# Patient Record
Sex: Male | Born: 1990 | Race: Black or African American | Hispanic: No | Marital: Single | Smoking: Current every day smoker
Health system: Southern US, Community
[De-identification: ages and names within clinical notes are randomized; demographics above are authoritative.]

## PROBLEM LIST (undated history)

## (undated) DIAGNOSIS — J45909 Unspecified asthma, uncomplicated: Secondary | ICD-10-CM

---

## 2016-04-30 ENCOUNTER — Encounter: Payer: Self-pay | Admitting: Emergency Medicine

## 2016-04-30 ENCOUNTER — Emergency Department
Admission: EM | Admit: 2016-04-30 | Discharge: 2016-04-30 | Disposition: A | Discharge status: Home / Self Care | Payer: Medicaid Other | Attending: Emergency Medicine | Admitting: Emergency Medicine

## 2016-04-30 DIAGNOSIS — Z760 Encounter for issue of repeat prescription: Secondary | ICD-10-CM | POA: Diagnosis present

## 2016-04-30 DIAGNOSIS — J4520 Mild intermittent asthma, uncomplicated: Secondary | ICD-10-CM

## 2016-04-30 DIAGNOSIS — F17200 Nicotine dependence, unspecified, uncomplicated: Secondary | ICD-10-CM | POA: Insufficient documentation

## 2016-04-30 HISTORY — DX: Unspecified asthma, uncomplicated: J45.909

## 2016-04-30 MED ORDER — IPRATROPIUM-ALBUTEROL 0.5-2.5 (3) MG/3ML IN SOLN
3.0000 mL | Freq: Once | RESPIRATORY_TRACT | Status: AC
  Administered 2016-04-30: 3 mL via RESPIRATORY_TRACT
  Filled 2016-04-30: qty 3

## 2016-04-30 MED ORDER — ALBUTEROL SULFATE HFA 108 (90 BASE) MCG/ACT IN AERS: 2.0000 | INHALATION_SPRAY | Freq: Four times a day (QID) | RESPIRATORY_TRACT | Status: DC | PRN

## 2016-04-30 NOTE — ED Provider Notes (Signed)
[facility] Emergency Department Provider Note   ____________________________________________  Time seen: Approximately 3:38 PM  I have reviewed the triage vital signs and the nursing notes.   HISTORY  Chief Complaint Asthma    HPI Randy Yates is a 25 y.o. male patient state he is having wheezing for 2 days. Patient states out of his albuterol inhaler. Patient used a friend's inhaler yesterday with some transient relief. Patient denies any URI signs symptoms. Patient denies any cough is wheezing episode. Patient denies any shortness of breath. Patient [place] AND DO NOT HAVE A PCP. Patient denies pain with this complaint. No other palliative measures for this complaint.   Past Medical History  Diagnosis Date  . Asthma     There are no active problems to display for this patient.   History reviewed. No pertinent past surgical history.  Current Outpatient Rx  Name  Route  Sig  Dispense  Refill  . albuterol (PROVENTIL HFA;VENTOLIN HFA) 108 (90 Base) MCG/ACT inhaler   Inhalation   Inhale 2 puffs into the lungs every 6 (six) hours as needed for wheezing or shortness of breath.   1 Inhaler   2     Allergies Review of patient's allergies indicates no known allergies.  History reviewed. No pertinent family history.  Social History Social History  Substance Use Topics  . Smoking status: Current Every Day Smoker  . Smokeless tobacco: None  . Alcohol Use: No    Review of Systems Constitutional: No fever/chills Eyes: No visual changes. ENT: No sore throat. Cardiovascular: Denies chest pain. Respiratory: Denies shortness of breath.Wheezing Gastrointestinal: No abdominal pain.  No nausea, no vomiting.  No diarrhea.  No constipation. Genitourinary: Negative for dysuria. Musculoskeletal: Negative for back pain. Skin: Negative for rash. Neurological: Negative for headaches, focal weakness or  numbness.    ____________________________________________   PHYSICAL EXAM:  VITAL SIGNS: ED Triage Vitals  Enc Vitals Group     BP 04/30/16 1529 140/96 mmHg     Pulse Rate 04/30/16 1529 86     Resp 04/30/16 1529 22     Temp 04/30/16 1529 98.3 F (36.8 C)     Temp Source 04/30/16 1529 Oral     SpO2 04/30/16 1529 99 %     Weight 04/30/16 1529 330 lb (149.687 kg)     Height 04/30/16 1529 6\' 2"  (1.88 m)     Head Cir --      Peak Flow --      Pain Score --      Pain Loc --      Pain Edu? --      Excl. in GC? --     Constitutional: Alert and oriented. Well appearing and in no acute distress. Morbid obesity Eyes: Conjunctivae are normal. PERRL. EOMI. Head: Atraumatic. Nose: No congestion/rhinnorhea. Mouth/Throat: Mucous membranes are moist.  Oropharynx non-erythematous. Neck: No stridor.  No cervical spine tenderness to palpation. Hematological/Lymphatic/Immunilogical: No cervical lymphadenopathy. Cardiovascular: Normal rate, regular rhythm. Grossly normal heart sounds.  Good peripheral circulation. Respiratory: Normal respiratory effort.  No retractions. Lungs wheezing left upper and lower lobes. Gastrointestinal: Soft and nontender. No distention. No abdominal bruits. No CVA tenderness. Musculoskeletal: No lower extremity tenderness nor edema.  No joint effusions. Neurologic:  Normal speech and language. No gross focal neurologic deficits are appreciated. No gait instability. Skin:  Skin is warm, dry and intact. No rash noted. Psychiatric: Mood and affect are normal. Speech and behavior  are normal.  ____________________________________________   LABS (all labs ordered are listed, but only abnormal results are displayed)  Labs Reviewed - No data to display ____________________________________________  EKG   ____________________________________________  RADIOLOGY   ____________________________________________   PROCEDURES  Procedure(s) performed:  None  Critical Care performed: No  ____________________________________________   INITIAL IMPRESSION / ASSESSMENT AND PLAN / ED COURSE  Pertinent labs & imaging results that were available during my care of the patient were reviewed by me and considered in my medical decision making (see chart for details).   asthma medication refill request. ___________________________________________   FINAL CLINICAL IMPRESSION(S) / ED DIAGNOSES  Final diagnoses:  Asthma, mild intermittent, uncomplicated  Encounter for medication refill      NEW MEDICATIONS STARTED DURING THIS VISIT:  New Prescriptions   ALBUTEROL (PROVENTIL HFA;VENTOLIN HFA) 108 (90 BASE) MCG/ACT INHALER    Inhale 2 puffs into the lungs every 6 (six) hours as needed for wheezing or shortness of breath.     Note:  This document was prepared using Dragon voice recognition software and may include unintentional dictation errors.    Joni Reining, PA-C 04/30/16 1610  Jennye Moccasin, MD 04/30/16 2012

## 2016-04-30 NOTE — ED Notes (Signed)
Pt presents to ED with wheezing. States that this is his asthma.

## 2016-04-30 NOTE — Discharge Instructions (Signed)
Asthma, Adult Asthma is a condition of the lungs in which the airways tighten and narrow. Asthma can make it hard to breathe. Asthma cannot be cured, but medicine and lifestyle changes can help control it. Asthma may be started (triggered) by:  Animal skin flakes (dander).  Dust.  Cockroaches.  Pollen.  Mold.  Smoke.  Cleaning products.  Hair sprays or aerosol sprays.  Paint fumes or strong smells.  Cold air, weather changes, and winds.  Crying or laughing hard.  Stress.  Certain medicines or drugs.  Foods, such as dried fruit, potato chips, and sparkling grape juice.  Infections or conditions (colds, flu).  Exercise.  Certain medical conditions or diseases.  Exercise or tiring activities. HOME CARE   Take medicine as told by your doctor.  Use a peak flow meter as told by your doctor. A peak flow meter is a tool that measures how well the lungs are working.  Record and keep track of the peak flow meter's readings.  Understand and use the asthma action plan. An asthma action plan is a written plan for taking care of your asthma and treating your attacks.  To help prevent asthma attacks:  Do not smoke. Stay away from secondhand smoke.  Change your heating and air conditioning filter often.  Limit your use of fireplaces and wood stoves.  Get rid of pests (such as roaches and mice) and their droppings.  Throw away plants if you see mold on them.  Clean your floors. Dust regularly. Use cleaning products that do not smell.  Have someone vacuum when you are not home. Use a vacuum cleaner with a HEPA filter if possible.  Replace carpet with wood, tile, or vinyl flooring. Carpet can trap animal skin flakes and dust.  Use allergy-proof pillows, mattress covers, and box spring covers.  Wash bed sheets and blankets every week in hot water and dry them in a dryer.  Use blankets that are made of polyester or cotton.  Clean bathrooms and kitchens with bleach.  If possible, have someone repaint the walls in these rooms with mold-resistant paint. Keep out of the rooms that are being cleaned and painted.  Wash hands often. GET HELP IF:  You have make a whistling sound when breaking (wheeze), have shortness of breath, or have a cough even if taking medicine to prevent attacks.  The colored mucus you cough up (sputum) is thicker than usual.  The colored mucus you cough up changes from clear or white to yellow, green, gray, or bloody.  You have problems from the medicine you are taking such as:  A rash.  Itching.  Swelling.  Trouble breathing.  You need reliever medicines more than 2-3 times a week.  Your peak flow measurement is still at 50-79% of your personal best after following the action plan for 1 hour.  You have a fever. GET HELP RIGHT AWAY IF:   You seem to be worse and are not responding to medicine during an asthma attack.  You are short of breath even at rest.  You get short of breath when doing very little activity.  You have trouble eating, drinking, or talking.  You have chest pain.  You have a fast heartbeat.  Your lips or fingernails start to turn blue.  You are light-headed, dizzy, or faint.  Your peak flow is less than 50% of your personal best.   This information is not intended to replace advice given to you by your health care provider. Make sure   you discuss any questions you have with your health care provider.   Document Released: 05/22/2008 Document Revised: 08/25/2015 Document Reviewed: 07/03/2013 Elsevier Interactive Patient Education 2016 [employer]  

## 2016-09-15 ENCOUNTER — Encounter: Payer: Self-pay | Admitting: Medical Oncology

## 2016-09-15 ENCOUNTER — Emergency Department
Admission: EM | Admit: 2016-09-15 | Discharge: 2016-09-15 | Disposition: A | Discharge status: Home / Self Care | Payer: Medicaid Other | Attending: Student | Admitting: Student

## 2016-09-15 ENCOUNTER — Emergency Department: Payer: Medicaid Other

## 2016-09-15 DIAGNOSIS — R05 Cough: Secondary | ICD-10-CM | POA: Diagnosis present

## 2016-09-15 DIAGNOSIS — J45901 Unspecified asthma with (acute) exacerbation: Secondary | ICD-10-CM | POA: Insufficient documentation

## 2016-09-15 DIAGNOSIS — F17200 Nicotine dependence, unspecified, uncomplicated: Secondary | ICD-10-CM | POA: Diagnosis not present

## 2016-09-15 MED ORDER — PREDNISONE 10 MG PO TABS: 50.0000 mg | ORAL_TABLET | Freq: Every day | ORAL | 0 refills | Status: DC

## 2016-09-15 MED ORDER — IPRATROPIUM-ALBUTEROL 0.5-2.5 (3) MG/3ML IN SOLN
RESPIRATORY_TRACT | Status: AC
  Administered 2016-09-15: 3 mL
  Filled 2016-09-15: qty 6

## 2016-09-15 MED ORDER — AZITHROMYCIN 250 MG PO TABS: ORAL_TABLET | ORAL | 0 refills | Status: DC

## 2016-09-15 MED ORDER — METHYLPREDNISOLONE SODIUM SUCC 40 MG IJ SOLR
INTRAMUSCULAR | Status: AC
  Administered 2016-09-15: 40 mg via INTRAMUSCULAR
  Filled 2016-09-15: qty 1

## 2016-09-15 MED ORDER — ALBUTEROL SULFATE HFA 108 (90 BASE) MCG/ACT IN AERS: 2.0000 | INHALATION_SPRAY | Freq: Four times a day (QID) | RESPIRATORY_TRACT | 2 refills | Status: DC | PRN

## 2016-09-15 MED ORDER — IPRATROPIUM-ALBUTEROL 0.5-2.5 (3) MG/3ML IN SOLN
3.0000 mL | Freq: Once | RESPIRATORY_TRACT | Status: AC
  Administered 2016-09-15: 3 mL via RESPIRATORY_TRACT

## 2016-09-15 MED ORDER — METHYLPREDNISOLONE SODIUM SUCC 40 MG IJ SOLR
40.0000 mg | Freq: Once | INTRAMUSCULAR | Status: AC
  Administered 2016-09-15: 40 mg via INTRAMUSCULAR

## 2016-09-15 MED ORDER — IPRATROPIUM-ALBUTEROL 0.5-2.5 (3) MG/3ML IN SOLN
RESPIRATORY_TRACT | Status: AC
  Administered 2016-09-15: 3 mL via RESPIRATORY_TRACT
  Filled 2016-09-15: qty 3

## 2016-09-15 MED ORDER — IPRATROPIUM-ALBUTEROL 0.5-2.5 (3) MG/3ML IN SOLN: 3.0000 mL | RESPIRATORY_TRACT | 0 refills | Status: AC | PRN

## 2016-09-15 NOTE — ED Triage Notes (Signed)
Pt reports he began having cold sx's which has flared his asthma. Pt reports he has lost his inhaler.

## 2016-09-15 NOTE — ED Notes (Signed)
After 2 duoneb treatments pt continued to have inspiratory and expiratory wheezing, EDP notified and orders initiated for another duoneb.. Will continue to monitor the pt.

## 2016-09-15 NOTE — ED Provider Notes (Signed)
[facility] Emergency Department Provider Note   ____________________________________________   None    (approximate)  I have reviewed the triage vital signs and the nursing notes.   HISTORY  Chief Complaint Asthma and URI    HPI Randy Yates is a 25 y.o. male who presents with a three-day history of cough and shortness of breath and wheezing. Patient states he is out of his albuterol inhaler. Feels like his chest is really tight. Denies any numbness tingling or chest pains.   Past Medical History:  Diagnosis Date  . Asthma     There are no active problems to display for this patient.   No past surgical history on file.  Prior to Admission medications   Medication Sig Start Date End Date Taking? Authorizing Provider  albuterol (PROVENTIL HFA;VENTOLIN HFA) 108 (90 Base) MCG/ACT inhaler Inhale 2 puffs into the lungs every 6 (six) hours as needed for wheezing or shortness of breath. 09/15/16   Charmayne Sheer Devra Stare, PA-C  azithromycin (ZITHROMAX Z-PAK) 250 MG tablet Take 2 tablets (500 mg) on  Day 1,  followed by 1 tablet (250 mg) once daily on Days 2 through 5. 09/15/16   Charmayne Sheer Halley Kincer, PA-C  ipratropium-albuterol (DUONEB) 0.5-2.5 (3) MG/3ML SOLN Take 3 mLs by nebulization every 4 (four) hours as needed. 09/15/16   Evangeline Dakin, PA-C  predniSONE (DELTASONE) 10 MG tablet Take 5 tablets (50 mg total) by mouth daily with breakfast. 09/15/16   Evangeline Dakin, PA-C    Allergies Review of patient's allergies indicates no known allergies.  No family history on file.  Social History Social History  Substance Use Topics  . Smoking status: Current Every Day Smoker  . Smokeless tobacco: Not on file  . Alcohol use No    Review of Systems Constitutional: No fever/chills Eyes: No visual changes. ENT: No sore throat. Cardiovascular: Denies chest pain. Respiratory: Positive shortness of breath with wheezing. Musculoskeletal: Negative for back  pain. Skin: Negative for rash. Neurological: Negative for headaches, focal weakness or numbness.  10-point ROS otherwise negative.  ____________________________________________   PHYSICAL EXAM:  VITAL SIGNS: ED Triage Vitals  Enc Vitals Group     BP 09/15/16 0712 (!) 148/89     Pulse Rate 09/15/16 0712 80     Resp 09/15/16 0712 20     Temp 09/15/16 0712 97.7 F (36.5 C)     Temp Source 09/15/16 0712 Oral     SpO2 09/15/16 0712 98 %     Weight 09/15/16 0713 (!) 330 lb (149.7 kg)     Height 09/15/16 0713 6\' 2"  (1.88 m)     Head Circumference --      Peak Flow --      Pain Score 09/15/16 0713 7     Pain Loc --      Pain Edu? --      Excl. in GC? --     Constitutional: Alert and oriented. Well appearing and in no acute distress. Eyes: Conjunctivae are normal. PERRL. EOMI. Head: Atraumatic. Nose: No congestion/rhinnorhea. Mouth/Throat: Mucous membranes are moist.  Oropharynx non-erythematous. Neck: No stridor.   Cardiovascular: Normal rate, regular rhythm. Grossly normal heart sounds.  Good peripheral circulation. Respiratory: Decreased breath sounds with wheezing scattered bilaterally both inspiration and expiration. Musculoskeletal: No lower extremity tenderness nor edema.  No joint effusions. Neurologic:  Normal speech and language. No gross focal neurologic deficits are appreciated. No gait instability. Skin:  Skin is warm, dry and intact. No rash  noted. Psychiatric: Mood and affect are normal. Speech and behavior are normal.  ____________________________________________   LABS (all labs ordered are listed, but only abnormal results are displayed)  Labs Reviewed - No data to display ____________________________________________  EKG   ____________________________________________  RADIOLOGY  IMPRESSION:  Mild bibasilar subsegmental atelectasis.    ____________________________________________   PROCEDURES  Procedure(s) performed: None  Procedures: 3  DuoNeb treatments given.  Critical Care performed: No  ____________________________________________   INITIAL IMPRESSION / ASSESSMENT AND PLAN / ED COURSE  Pertinent labs & imaging results that were available during my care of the patient were reviewed by me and considered in my medical decision making (see chart for details).  Case exacerbation of asthma with bibasilar atelectasis. Patient discharged home with albuterol/Atrovent inhalation for machine. Prednisone daily 5 days. Refill albuterol inhaler. Patient follow-up with or establish local health care provider. Rx given for Z-Pak  Clinical Course   Reexam after 3 breathing treatments patient is improved slightly still having some basilar wheezing. Patient fell stable not gone take his medication. He understands return to the ER for any worsening symptomology.  ____________________________________________   FINAL CLINICAL IMPRESSION(S) / ED DIAGNOSES  Final diagnoses:  Asthma exacerbation      NEW MEDICATIONS STARTED DURING THIS VISIT:  New Prescriptions   ALBUTEROL (PROVENTIL HFA;VENTOLIN HFA) 108 (90 BASE) MCG/ACT INHALER    Inhale 2 puffs into the lungs every 6 (six) hours as needed for wheezing or shortness of breath.   AZITHROMYCIN (ZITHROMAX Z-PAK) 250 MG TABLET    Take 2 tablets (500 mg) on  Day 1,  followed by 1 tablet (250 mg) once daily on Days 2 through 5.   IPRATROPIUM-ALBUTEROL (DUONEB) 0.5-2.5 (3) MG/3ML SOLN    Take 3 mLs by nebulization every 4 (four) hours as needed.   PREDNISONE (DELTASONE) 10 MG TABLET    Take 5 tablets (50 mg total) by mouth daily with breakfast.     Note:  This document was prepared using Dragon voice recognition software and may include unintentional dictation errors.   Evangeline Dakinharles M Yuktha Kerchner, PA-C 09/15/16 [id number]    Gayla DossEryka A Gayle, MD 09/15/16 1032

## 2016-10-25 ENCOUNTER — Emergency Department
Admission: EM | Admit: 2016-10-25 | Discharge: 2016-10-25 | Disposition: A | Discharge status: Home / Self Care | Payer: Medicaid Other | Attending: Emergency Medicine | Admitting: Emergency Medicine

## 2016-10-25 ENCOUNTER — Encounter: Payer: Self-pay | Admitting: Emergency Medicine

## 2016-10-25 DIAGNOSIS — Z79899 Other long term (current) drug therapy: Secondary | ICD-10-CM | POA: Diagnosis not present

## 2016-10-25 DIAGNOSIS — F17200 Nicotine dependence, unspecified, uncomplicated: Secondary | ICD-10-CM | POA: Diagnosis not present

## 2016-10-25 DIAGNOSIS — R05 Cough: Secondary | ICD-10-CM | POA: Diagnosis present

## 2016-10-25 DIAGNOSIS — J9801 Acute bronchospasm: Secondary | ICD-10-CM | POA: Diagnosis not present

## 2016-10-25 MED ORDER — DEXAMETHASONE SODIUM PHOSPHATE 10 MG/ML IJ SOLN
10.0000 mg | Freq: Once | INTRAMUSCULAR | Status: AC
  Administered 2016-10-25: 10 mg via INTRAMUSCULAR
  Filled 2016-10-25: qty 1

## 2016-10-25 MED ORDER — IPRATROPIUM-ALBUTEROL 0.5-2.5 (3) MG/3ML IN SOLN
3.0000 mL | Freq: Once | RESPIRATORY_TRACT | Status: AC
  Administered 2016-10-25: 3 mL via RESPIRATORY_TRACT
  Filled 2016-10-25: qty 3

## 2016-10-25 MED ORDER — METHYLPREDNISOLONE 4 MG PO TBPK: ORAL_TABLET | ORAL | 0 refills | Status: AC

## 2016-10-25 MED ORDER — PSEUDOEPH-BROMPHEN-DM 30-2-10 MG/5ML PO SYRP: 5.0000 mL | ORAL_SOLUTION | Freq: Four times a day (QID) | ORAL | 0 refills | Status: AC | PRN

## 2016-10-25 MED ORDER — BENZONATATE 100 MG PO CAPS
200.0000 mg | ORAL_CAPSULE | Freq: Once | ORAL | Status: AC
  Administered 2016-10-25: 200 mg via ORAL
  Filled 2016-10-25: qty 2

## 2016-10-25 NOTE — Discharge Instructions (Signed)
Continue previous medications

## 2016-10-25 NOTE — ED Triage Notes (Signed)
Cough and wheezing for two days, has used puffer with some relief. NAD.

## 2016-10-25 NOTE — ED Provider Notes (Signed)
[facility] Emergency Department Provider Note   ____________________________________________   First MD Initiated Contact with Patient 10/25/16 1237     (approximate)  I have reviewed the triage vital signs and the nursing notes.   HISTORY  Chief Complaint Cough and Wheezing    HPI Randy Yates is a 25 y.o. male patient complaining of cough and wheezing for 2 days. Patient state his inhaler is not helping with his asthmatic condition.Patient denies any fever or chills associated this complaint. Patient states cough is nonproductive. No other palliative measures for this complaint.   Past Medical History:  Diagnosis Date  . Asthma     There are no active problems to display for this patient.   History reviewed. No pertinent surgical history.  Prior to Admission medications   Medication Sig Start Date End Date Taking? Authorizing Provider  albuterol (PROVENTIL HFA;VENTOLIN HFA) 108 (90 Base) MCG/ACT inhaler Inhale 2 puffs into the lungs every 6 (six) hours as needed for wheezing or shortness of breath. 09/15/16   Charmayne Sheerharles M Beers, PA-C  azithromycin (ZITHROMAX Z-PAK) 250 MG tablet Take 2 tablets (500 mg) on  Day 1,  followed by 1 tablet (250 mg) once daily on Days 2 through 5. 09/15/16   Evangeline Dakinharles M Beers, PA-C  brompheniramine-pseudoephedrine-DM 30-2-10 MG/5ML syrup Take 5 mLs by mouth 4 (four) times daily as needed. 10/25/16   Randy Reiningonald K Leslea Vowles, PA-C  ipratropium-albuterol (DUONEB) 0.5-2.5 (3) MG/3ML SOLN Take 3 mLs by nebulization every 4 (four) hours as needed. 09/15/16   Evangeline Dakinharles M Beers, PA-C  methylPREDNISolone (MEDROL DOSEPAK) 4 MG TBPK tablet Take Tapered dose as directed 10/25/16   Randy Reiningonald K Maryem Shuffler, PA-C  predniSONE (DELTASONE) 10 MG tablet Take 5 tablets (50 mg total) by mouth daily with breakfast. 09/15/16   Evangeline Dakinharles M Beers, PA-C    Allergies Patient has no known allergies.  No family history on file.  Social History Social History  Substance Use  Topics  . Smoking status: Current Every Day Smoker  . Smokeless tobacco: Never Used  . Alcohol use No    Review of Systems Constitutional: No fever/chills Eyes: No visual changes. ENT: No sore throat. Cardiovascular: Denies chest pain. Respiratory:Wheezing and nonproductive cough Gastrointestinal: No abdominal pain.  No nausea, no vomiting.  No diarrhea.  No constipation. Genitourinary: Negative for dysuria. Musculoskeletal: Negative for back pain. Skin: Negative for rash. Neurological: Negative for headaches, focal weakness or numbness.    ____________________________________________   PHYSICAL EXAM:  VITAL SIGNS: ED Triage Vitals  Enc Vitals Group     BP 10/25/16 1214 (!) 142/15     Pulse Rate 10/25/16 1214 98     Resp 10/25/16 1214 20     Temp 10/25/16 1214 97.6 F (36.4 C)     Temp Source 10/25/16 1214 Oral     SpO2 10/25/16 1214 98 %     Weight 10/25/16 1215 (!) 350 lb (158.8 kg)     Height 10/25/16 1215 6\' 2"  (1.88 m)     Head Circumference --      Peak Flow --      Pain Score --      Pain Loc --      Pain Edu? --      Excl. in GC? --     Constitutional: Alert and oriented. Well appearing and in no acute distress. Eyes: Conjunctivae are normal. PERRL. EOMI. Head: Atraumatic. Nose: No congestion/rhinnorhea. Mouth/Throat: Mucous membranes are moist.  Oropharynx non-erythematous. Neck: No stridor.  No  cervical spine tenderness to palpation. Hematological/Lymphatic/Immunilogical: No cervical lymphadenopathy. Cardiovascular: Normal rate, regular rhythm. Grossly normal heart sounds.  Good peripheral circulation. Respiratory: Normal respiratory effort.  No retractions. Lungs CTAB. Gastrointestinal: Soft and nontender. No distention. No abdominal bruits. No CVA tenderness. Musculoskeletal: No lower extremity tenderness nor edema.  No joint effusions. Neurologic:  Normal speech and language. No gross focal neurologic deficits are appreciated. No gait  instability. Skin:  Skin is warm, dry and intact. No rash noted. Psychiatric: Mood and affect are normal. Speech and behavior are normal.  ____________________________________________   LABS (all labs ordered are listed, but only abnormal results are displayed)  Labs Reviewed - No data to display ____________________________________________  EKG   ____________________________________________  RADIOLOGY   ____________________________________________   PROCEDURES  Procedure(s) performed: None  Procedures  Critical Care performed: No  ____________________________________________   INITIAL IMPRESSION / ASSESSMENT AND PLAN / ED COURSE  Pertinent labs & imaging results that were available during my care of the patient were reviewed by me and considered in my medical decision making (see chart for details).  Bronchospasm with cough. Patient given discharge care instructions. [facility] Medrol Dose[facility]ak. Patient advised continued his inhaler as needed.  Clinical Course   Patient states more than 80% improved status post DuoNeb.   ____________________________________________   FINAL CLINICAL IMPRESSION(S) / ED DIAGNOSES  Final diagnoses:  Bronchospasm      NEW MEDICATIONS STARTED DURING THIS VISIT:  New Prescriptions   BROMPHENIRAMINE-PSEUDOEPHEDRINE-DM 30-2-10 MG/5ML SYRUP    Take 5 mLs by mouth 4 (four) times daily as needed.   METHYLPREDNISOLONE (MEDROL DOSEPAK) 4 MG TBPK TABLET    Take Tapered dose as directed     Note:  This document was prepared using Dragon voice recognition software and may include unintentional dictation errors.    Randy Reiningonald K Tausha Milhoan, PA-C 10/25/16 1327    Minna AntisKevin Paduchowski, MD 10/25/16 1414

## 2017-06-02 ENCOUNTER — Emergency Department
Admission: EM | Admit: 2017-06-02 | Discharge: 2017-06-02 | Disposition: A | Discharge status: Home / Self Care | Payer: Medicaid Other | Attending: Emergency Medicine | Admitting: Emergency Medicine

## 2017-06-02 ENCOUNTER — Encounter: Payer: Self-pay | Admitting: *Deleted

## 2017-06-02 DIAGNOSIS — F17200 Nicotine dependence, unspecified, uncomplicated: Secondary | ICD-10-CM | POA: Insufficient documentation

## 2017-06-02 DIAGNOSIS — J4521 Mild intermittent asthma with (acute) exacerbation: Secondary | ICD-10-CM | POA: Diagnosis not present

## 2017-06-02 DIAGNOSIS — R0602 Shortness of breath: Secondary | ICD-10-CM | POA: Diagnosis present

## 2017-06-02 MED ORDER — ALBUTEROL SULFATE (2.5 MG/3ML) 0.083% IN NEBU
INHALATION_SOLUTION | RESPIRATORY_TRACT | Status: AC
  Filled 2017-06-02: qty 6

## 2017-06-02 MED ORDER — METHYLPREDNISOLONE SODIUM SUCC 125 MG IJ SOLR
125.0000 mg | Freq: Once | INTRAMUSCULAR | Status: AC
Start: 2017-06-02 — End: 2017-06-02
  Administered 2017-06-02: 125 mg via INTRAMUSCULAR
  Filled 2017-06-02: qty 2

## 2017-06-02 MED ORDER — IPRATROPIUM-ALBUTEROL 0.5-2.5 (3) MG/3ML IN SOLN
3.0000 mL | Freq: Once | RESPIRATORY_TRACT | Status: AC
  Administered 2017-06-02: 3 mL via RESPIRATORY_TRACT
  Filled 2017-06-02: qty 3

## 2017-06-02 MED ORDER — ALBUTEROL SULFATE (2.5 MG/3ML) 0.083% IN NEBU
5.0000 mg | INHALATION_SOLUTION | Freq: Once | RESPIRATORY_TRACT | Status: AC
  Administered 2017-06-02: 5 mg via RESPIRATORY_TRACT

## 2017-06-02 MED ORDER — PREDNISONE 10 MG PO TABS: 50.0000 mg | ORAL_TABLET | Freq: Every day | ORAL | 0 refills | Status: AC

## 2017-06-02 MED ORDER — ALBUTEROL SULFATE (2.5 MG/3ML) 0.083% IN NEBU: 2.5000 mg | INHALATION_SOLUTION | Freq: Four times a day (QID) | RESPIRATORY_TRACT | 2 refills | Status: AC | PRN

## 2017-06-02 MED ORDER — AZITHROMYCIN 250 MG PO TABS: ORAL_TABLET | ORAL | 0 refills | Status: AC

## 2017-06-02 MED ORDER — ALBUTEROL SULFATE HFA 108 (90 BASE) MCG/ACT IN AERS: 2.0000 | INHALATION_SPRAY | Freq: Four times a day (QID) | RESPIRATORY_TRACT | 2 refills | Status: AC | PRN

## 2017-06-02 NOTE — ED Triage Notes (Signed)
Pt is able to speak in complete sentences, audible wheezing heard

## 2017-06-02 NOTE — ED Provider Notes (Signed)
[facility] Emergency Department Provider Note  ____________________________________________  Time seen: Approximately 11:15 AM  I have reviewed the triage vital signs and the nursing notes.   HISTORY  Chief Complaint Shortness of Breath   HPI Randy Yates is a 26 y.o. male who presents to the emergency department for evaluation of dyspnea. He states he has been using his aunts nebulizer, but continues to have wheezing. He states symptoms started 2 weeks ago when he came down with a cold. He has not established a primary care provider and therefore cannot get any refills on his medications. He denies fever. He does smoke cigarettes daily. He has had shortness of breath with exertion and states that the wheezing increases at night.   Past Medical History:  Diagnosis Date  . Asthma     There are no active problems to display for this patient.   History reviewed. No pertinent surgical history.  Prior to Admission medications   Medication Sig Start Date End Date Taking? Authorizing Provider  albuterol (PROVENTIL HFA;VENTOLIN HFA) 108 (90 Base) MCG/ACT inhaler Inhale 2 puffs into the lungs every 6 (six) hours as needed for wheezing or shortness of breath. 06/02/17   Venise Ellingwood B, FNP  albuterol (PROVENTIL) (2.5 MG/3ML) 0.083% nebulizer solution Take 3 mLs (2.5 mg total) by nebulization every 6 (six) hours as needed for wheezing or shortness of breath. 06/02/17   Arelene Moroni, Rulon Eisenmengerari B, FNP  azithromycin (ZITHROMAX Z-PAK) 250 MG tablet Take 2 tablets (500 mg) on  Day 1,  followed by 1 tablet (250 mg) once daily on Days 2 through 5. 06/02/17   Jarrod Bodkins B, FNP  brompheniramine-pseudoephedrine-DM 30-2-10 MG/5ML syrup Take 5 mLs by mouth 4 (four) times daily as needed. 10/25/16   Joni ReiningSmith, Ronald K, PA-C  ipratropium-albuterol (DUONEB) 0.5-2.5 (3) MG/3ML SOLN Take 3 mLs by nebulization every 4 (four) hours as needed. 09/15/16   Beers, Charmayne Sheerharles M, PA-C  methylPREDNISolone  (MEDROL DOSEPAK) 4 MG TBPK tablet Take Tapered dose as directed 10/25/16   Joni ReiningSmith, Ronald K, PA-C  predniSONE (DELTASONE) 10 MG tablet Take 5 tablets (50 mg total) by mouth daily with breakfast. 06/02/17   Kem Boroughsriplett, Adriene Knipfer B, FNP    Allergies Patient has no known allergies.  No family history on file.  Social History Social History  Substance Use Topics  . Smoking status: Current Every Day Smoker  . Smokeless tobacco: Never Used  . Alcohol use No    Review of Systems Constitutional: Negative for fever/chills ENT: Negative for sore throat. Cardiovascular: Denies chest pain. Respiratory: Positive shortness of breath. Positive for cough. Gastrointestinal: Negative for nausea,  no vomiting.   Musculoskeletal: Negative for body aches Skin: Negative for rash. Neurological: Negative for headaches ____________________________________________   PHYSICAL EXAM:  VITAL SIGNS: ED Triage Vitals  Enc Vitals Group     BP 06/02/17 1027 133/89     Pulse Rate 06/02/17 1027 (!) 102     Resp 06/02/17 1027 (!) 22     Temp 06/02/17 1027 97.8 F (36.6 C)     Temp Source 06/02/17 1027 Oral     SpO2 06/02/17 1027 98 %     Weight 06/02/17 1028 (!) 360 lb (163.3 kg)     Height 06/02/17 1028 6\' 2"  (1.88 m)     Head Circumference --      Peak Flow --      Pain Score 06/02/17 1113 0     Pain Loc --      Pain Edu? --  Excl. in GC? --     Constitutional: Alert and oriented. Well appearing and in no acute distress. Eyes: Conjunctivae are normal.  Nose: No congestion noted; no rhinnorhea. Mouth/Throat: Mucous membranes are moist.  Oropharynx normal. Tonsils not visualized. Neck: No stridor.  Lymphatic: No cervical lymphadenopathy. Cardiovascular: Normal rate, regular rhythm. Good peripheral circulation. Respiratory: Normal respiratory effort.  No retractions. Expiratory wheezes noted in the right base and left upper and lower lobes on auscultation. Gastrointestinal: Soft and nontender.   Musculoskeletal: FROM x 4 extremities.  Neurologic:  Normal speech and language.  Skin:  Skin is warm, dry and intact. No rash noted. Psychiatric: Mood and affect are normal. Speech and behavior are normal.  ____________________________________________   LABS (all labs ordered are listed, but only abnormal results are displayed)  Labs Reviewed - No data to display ____________________________________________  EKG  Not indicated ____________________________________________  RADIOLOGY  None ____________________________________________   PROCEDURES  Procedure(s) performed: None  Critical Care performed: No ____________________________________________   INITIAL IMPRESSION / ASSESSMENT AND PLAN / ED COURSE  26 year old male sent to the emergency department for evaluation and treatment of asthma exacerbation. While in the emergency department, he was given one albuterol treatment then one DuoNeb treatment with significant improvement of air movement. Wheezing significantly decreased and breath sounds improved. He will be treated with prednisone, azithromycin, albuterol inhaler and albuterol solution. He was instructed to schedule follow-up appointment with primary care if his choice as soon as possible. He was instructed to return to the emergency department for symptoms that change or worsen if he is unable to see a primary care.  Pertinent labs & imaging results that were available during my care of the patient were reviewed by me and considered in my medical decision making (see chart for details).  Discharge Medication List as of 06/02/2017 12:24 PM    START taking these medications   Details  albuterol (PROVENTIL) (2.5 MG/3ML) 0.083% nebulizer solution Take 3 mLs (2.5 mg total) by nebulization every 6 (six) hours as needed for wheezing or shortness of breath., Starting Sat 06/02/2017, Print        If controlled substance prescribed during this visit, 12 month history  viewed on the [facility] prior to issuing an initial prescription for Schedule II or III opiod. ____________________________________________   FINAL CLINICAL IMPRESSION(S) / ED DIAGNOSES  Final diagnoses:  Mild intermittent asthma with exacerbation    Note:  This document was prepared using Dragon voice recognition software and may include unintentional dictation errors.     Chinita Pester, FNP 06/02/17 1527    Sharyn Creamer, MD 06/02/17 [phone]

## 2017-06-02 NOTE — ED Notes (Signed)
Pt stating that he is coming in "for my asthma." Pt has no inhalers at home. Pt stating the last two weeks he can not even sleep because "i'm always about to have an asthma." Pt has been experiencing post nasal drip.

## 2017-06-02 NOTE — ED Triage Notes (Signed)
Pt has a history of asthma, pt reports having a cold, triggering his asthma, pt complains of dyspnea

## 2017-06-16 IMAGING — CR DG CHEST 2V
2 series · 2 of 2 positions shown · non-contrast
Comparison: No prior.

CLINICAL DATA: Cough and wheezing.

EXAM:
CHEST  2 VIEW

[chest pa]
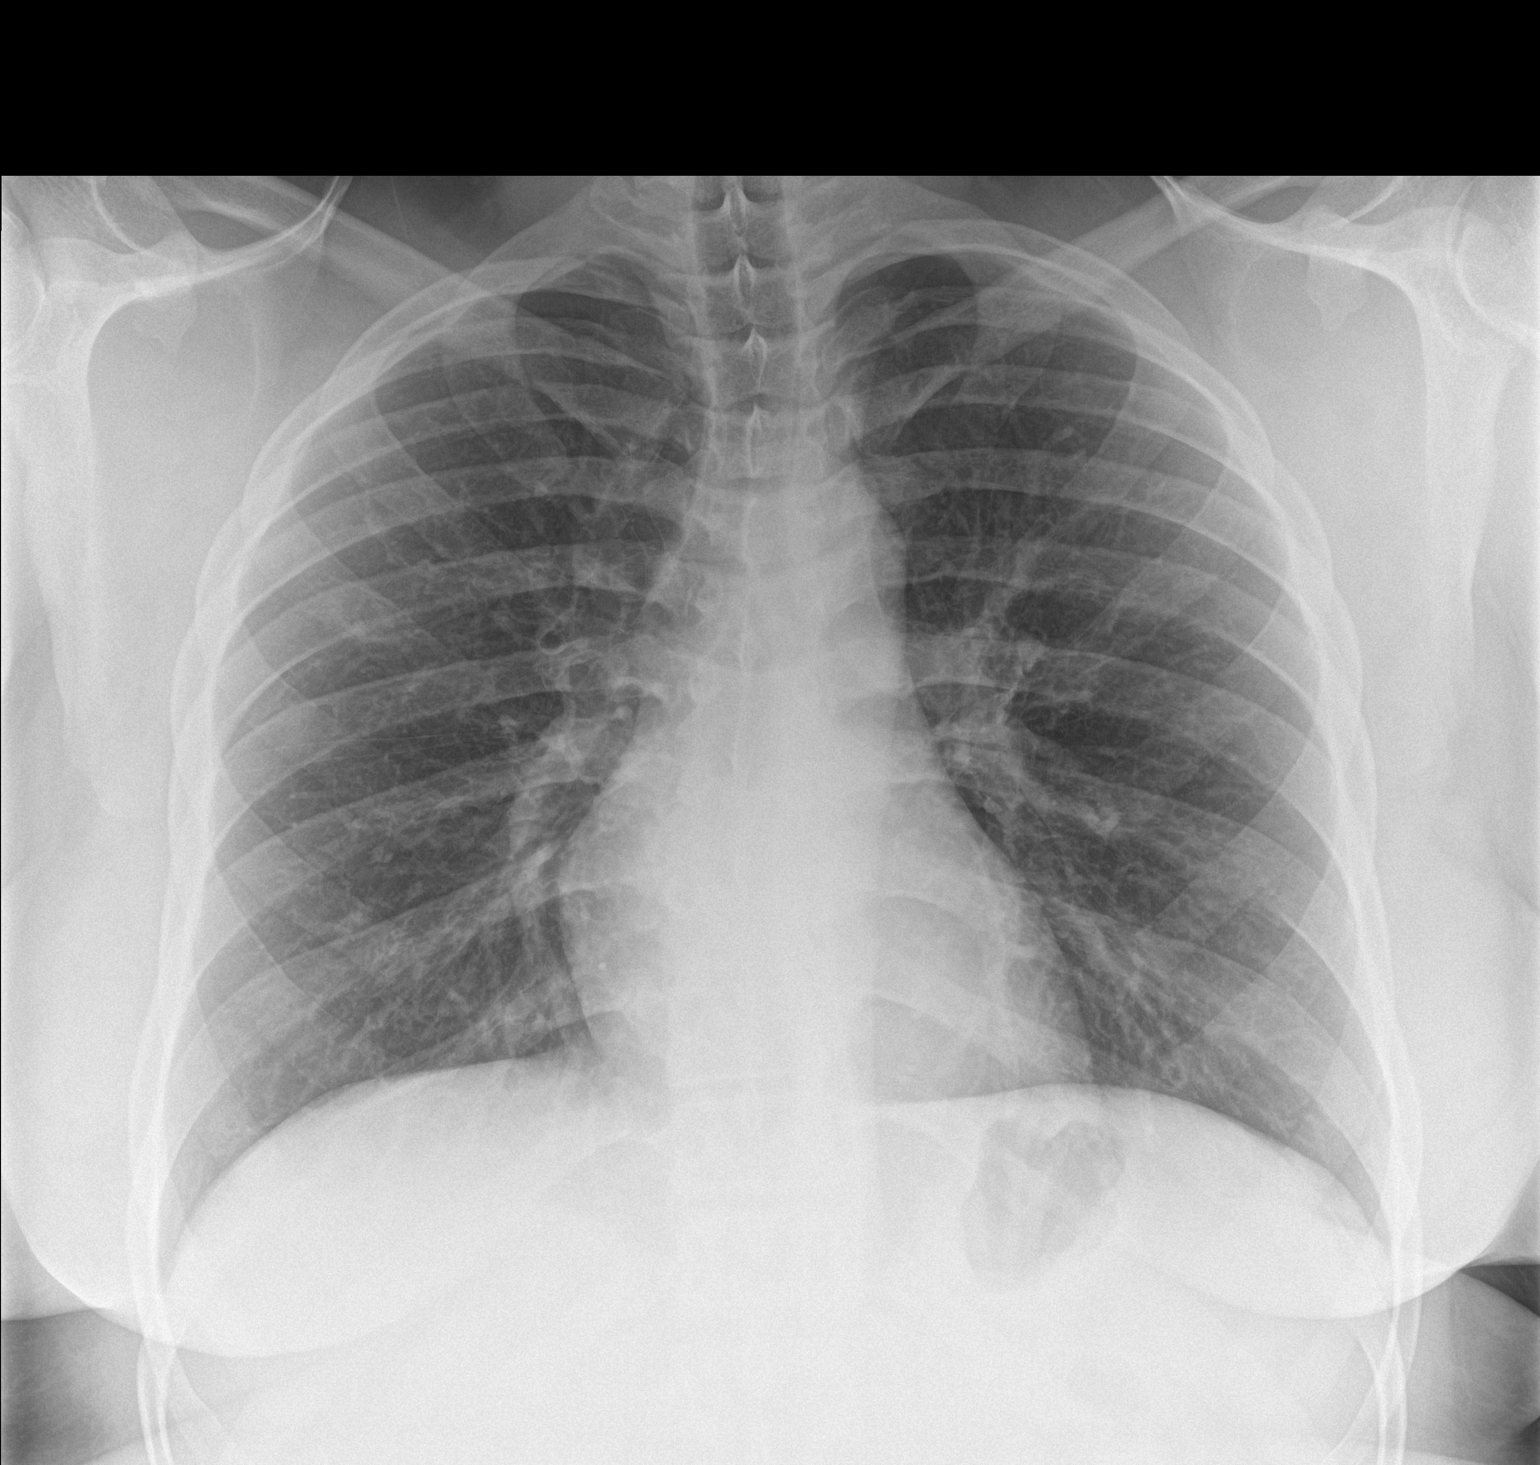

[chest lat]
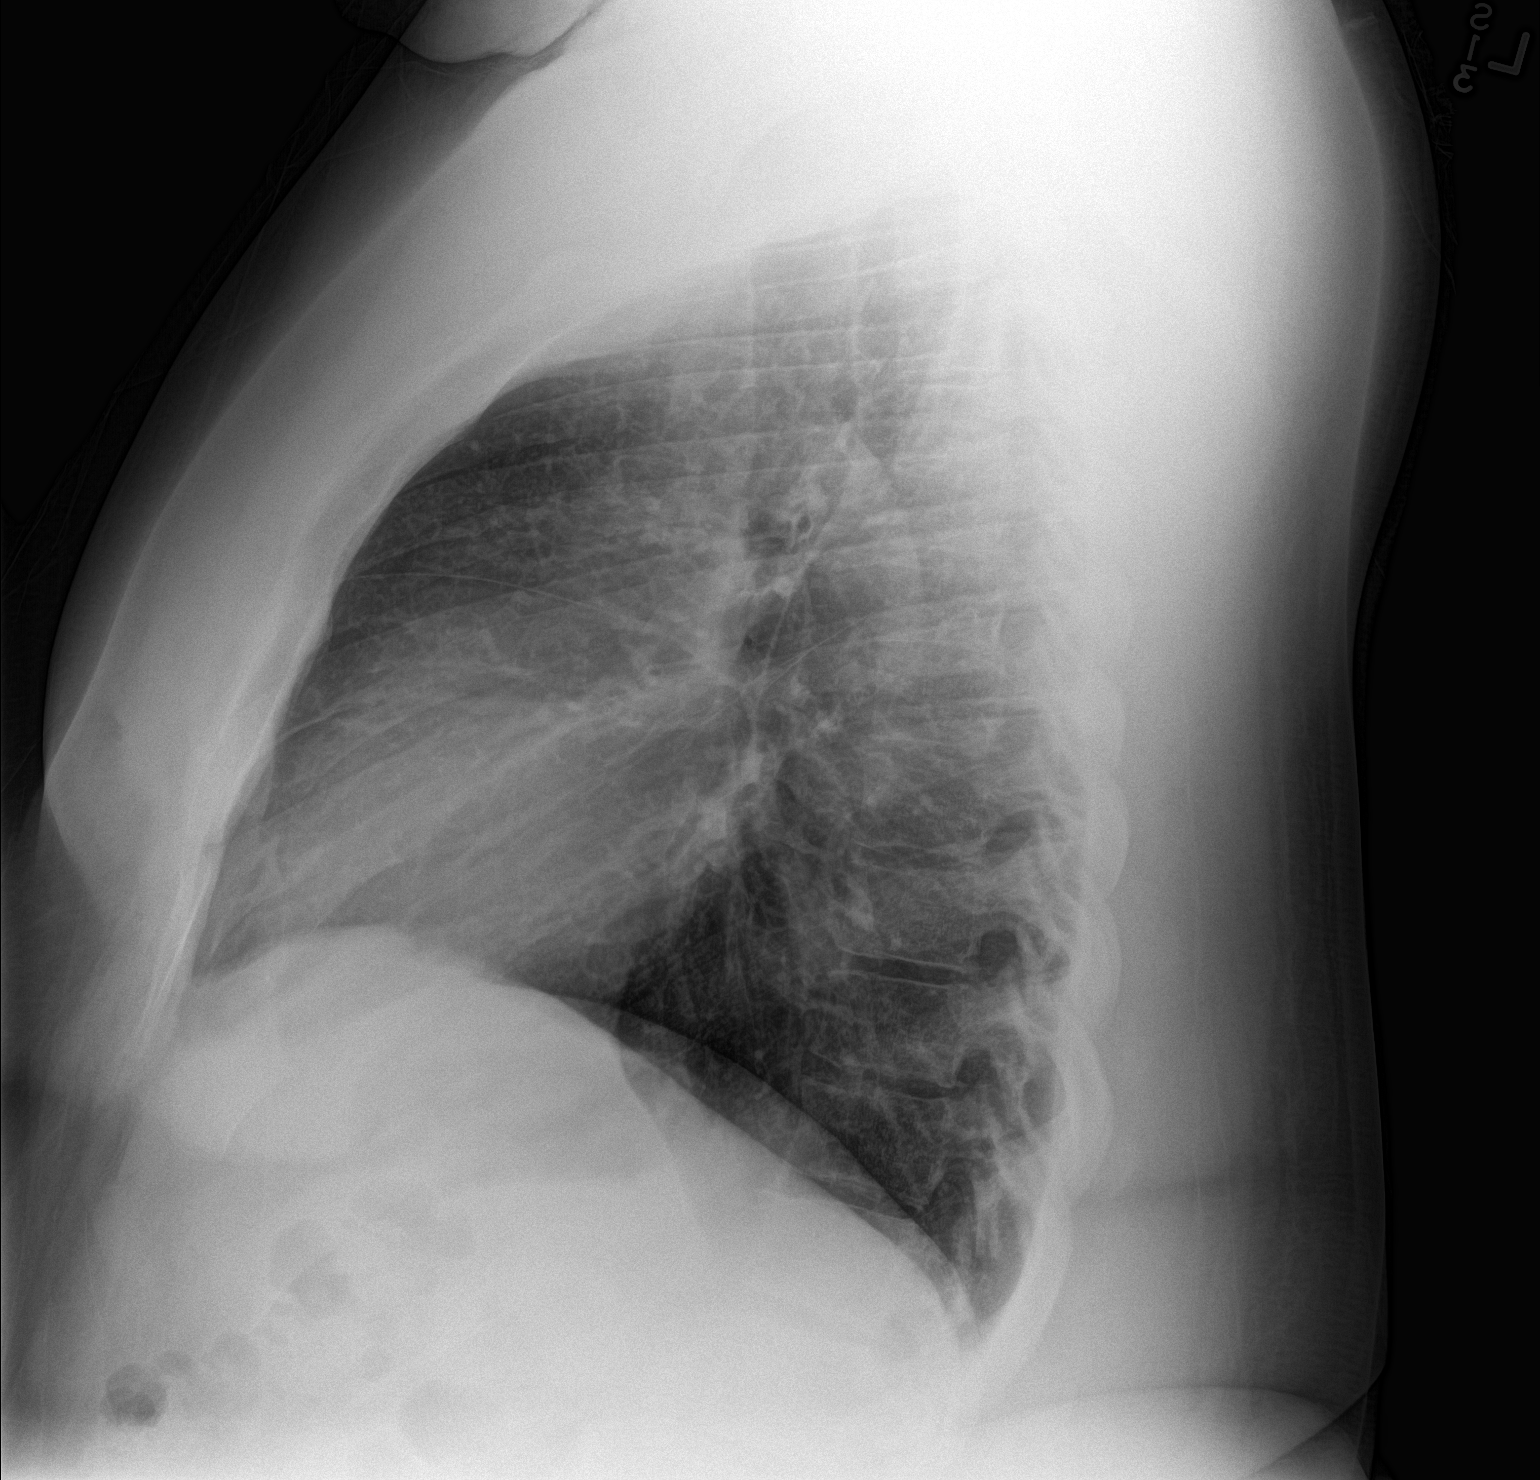

[2 of 2 positions shown; findings below may reference images not displayed]

FINDINGS: Mediastinum and hilar structures normal. Mild bibasilar subsegmental
atelectasis. No pleural effusion or pneumothorax. Heart size normal.
No acute bony abnormality.
IMPRESSION: Mild bibasilar subsegmental atelectasis.
